# Patient Record
Sex: Female | Born: 1935 | Race: White | Hispanic: No | Marital: Married | Smoking: Never smoker
Health system: Southern US, Community
[De-identification: ages and names within clinical notes are randomized; demographics above are authoritative.]

## PROBLEM LIST (undated history)

## (undated) HISTORY — PX: GANGLION CYST EXCISION: SHX1691

## (undated) HISTORY — PX: JOINT REPLACEMENT: SHX530

## (undated) HISTORY — PX: RECTOCELE REPAIR: SHX761

## (undated) HISTORY — PX: TUBAL LIGATION: SHX77

---

## 2017-01-29 ENCOUNTER — Emergency Department (HOSPITAL_COMMUNITY): Payer: Medicare Other

## 2017-01-29 ENCOUNTER — Observation Stay (HOSPITAL_COMMUNITY)
Admission: EM | Admit: 2017-01-29 | Discharge: 2017-01-30 | Disposition: A | Discharge status: Home / Self Care | Payer: Medicare Other | Attending: Surgery | Admitting: Surgery

## 2017-01-29 ENCOUNTER — Encounter (HOSPITAL_COMMUNITY): Payer: Self-pay | Admitting: *Deleted

## 2017-01-29 DIAGNOSIS — K8020 Calculus of gallbladder without cholecystitis without obstruction: Secondary | ICD-10-CM | POA: Diagnosis not present

## 2017-01-29 DIAGNOSIS — K5730 Diverticulosis of large intestine without perforation or abscess without bleeding: Secondary | ICD-10-CM | POA: Insufficient documentation

## 2017-01-29 DIAGNOSIS — Z96642 Presence of left artificial hip joint: Secondary | ICD-10-CM | POA: Insufficient documentation

## 2017-01-29 DIAGNOSIS — S62309A Unspecified fracture of unspecified metacarpal bone, initial encounter for closed fracture: Secondary | ICD-10-CM

## 2017-01-29 DIAGNOSIS — S62302A Unspecified fracture of third metacarpal bone, right hand, initial encounter for closed fracture: Secondary | ICD-10-CM | POA: Insufficient documentation

## 2017-01-29 DIAGNOSIS — Z9851 Tubal ligation status: Secondary | ICD-10-CM | POA: Insufficient documentation

## 2017-01-29 DIAGNOSIS — E042 Nontoxic multinodular goiter: Secondary | ICD-10-CM | POA: Insufficient documentation

## 2017-01-29 DIAGNOSIS — Z91013 Allergy to seafood: Secondary | ICD-10-CM | POA: Insufficient documentation

## 2017-01-29 DIAGNOSIS — Z880 Allergy status to penicillin: Secondary | ICD-10-CM | POA: Insufficient documentation

## 2017-01-29 DIAGNOSIS — S2249XA Multiple fractures of ribs, unspecified side, initial encounter for closed fracture: Secondary | ICD-10-CM | POA: Diagnosis present

## 2017-01-29 DIAGNOSIS — G629 Polyneuropathy, unspecified: Secondary | ICD-10-CM | POA: Diagnosis not present

## 2017-01-29 DIAGNOSIS — M6281 Muscle weakness (generalized): Secondary | ICD-10-CM | POA: Diagnosis not present

## 2017-01-29 DIAGNOSIS — I700 Atherosclerosis of aorta: Secondary | ICD-10-CM | POA: Insufficient documentation

## 2017-01-29 DIAGNOSIS — S2242XA Multiple fractures of ribs, left side, initial encounter for closed fracture: Principal | ICD-10-CM | POA: Insufficient documentation

## 2017-01-29 DIAGNOSIS — S62304A Unspecified fracture of fourth metacarpal bone, right hand, initial encounter for closed fracture: Secondary | ICD-10-CM | POA: Insufficient documentation

## 2017-01-29 LAB — CBC WITH DIFFERENTIAL/PLATELET
BASOS ABS: 0 10*3/uL (ref 0.0–0.1)
BASOS PCT: 0 %
Eosinophils Absolute: 0.1 10*3/uL (ref 0.0–0.7)
Eosinophils Relative: 0 %
HCT: 39.5 % (ref 36.0–46.0)
HEMOGLOBIN: 12.9 g/dL (ref 12.0–15.0)
LYMPHS PCT: 5 %
Lymphs Abs: 0.7 10*3/uL (ref 0.7–4.0)
MCH: 28.3 pg (ref 26.0–34.0)
MCHC: 32.7 g/dL (ref 30.0–36.0)
MCV: 86.6 fL (ref 78.0–100.0)
Monocytes Absolute: 0.8 10*3/uL (ref 0.1–1.0)
Monocytes Relative: 6 %
NEUTROS ABS: 12.2 10*3/uL — AB (ref 1.7–7.7)
NEUTROS PCT: 89 %
Platelets: 232 10*3/uL (ref 150–400)
RBC: 4.56 MIL/uL (ref 3.87–5.11)
RDW: 15.5 % (ref 11.5–15.5)
WBC: 13.8 10*3/uL — ABNORMAL HIGH (ref 4.0–10.5)

## 2017-01-29 LAB — I-STAT CHEM 8, ED
BUN: 10 mg/dL (ref 6–20)
CHLORIDE: 104 mmol/L (ref 101–111)
CREATININE: 0.5 mg/dL (ref 0.44–1.00)
Calcium, Ion: 1.17 mmol/L (ref 1.15–1.40)
Glucose, Bld: 125 mg/dL — ABNORMAL HIGH (ref 65–99)
HEMATOCRIT: 39 % (ref 36.0–46.0)
HEMOGLOBIN: 13.3 g/dL (ref 12.0–15.0)
POTASSIUM: 3.2 mmol/L — AB (ref 3.5–5.1)
Sodium: 142 mmol/L (ref 135–145)
TCO2: 26 mmol/L (ref 0–100)

## 2017-01-29 MED ORDER — SODIUM CHLORIDE 0.9 % IV SOLN: 250.0000 mL | INTRAVENOUS | Status: DC | PRN

## 2017-01-29 MED ORDER — ONDANSETRON HCL 4 MG/2ML IJ SOLN: 4.0000 mg | Freq: Four times a day (QID) | INTRAMUSCULAR | Status: DC | PRN

## 2017-01-29 MED ORDER — SODIUM CHLORIDE 0.9% FLUSH: 3.0000 mL | INTRAVENOUS | Status: DC | PRN

## 2017-01-29 MED ORDER — DOCUSATE SODIUM 100 MG PO CAPS
100.0000 mg | ORAL_CAPSULE | Freq: Two times a day (BID) | ORAL | Status: DC
  Filled 2017-01-29: qty 1

## 2017-01-29 MED ORDER — OXYCODONE HCL 5 MG PO TABS: 5.0000 mg | ORAL_TABLET | ORAL | Status: DC | PRN

## 2017-01-29 MED ORDER — IBUPROFEN 800 MG PO TABS
800.0000 mg | ORAL_TABLET | Freq: Three times a day (TID) | ORAL | Status: DC
  Administered 2017-01-29 – 2017-01-30 (×2): 800 mg via ORAL
  Filled 2017-01-29 (×2): qty 1

## 2017-01-29 MED ORDER — LOSARTAN POTASSIUM 25 MG PO TABS
25.0000 mg | ORAL_TABLET | Freq: Every day | ORAL | Status: DC
  Administered 2017-01-30: 25 mg via ORAL
  Filled 2017-01-29: qty 1

## 2017-01-29 MED ORDER — METHOCARBAMOL 500 MG PO TABS
500.0000 mg | ORAL_TABLET | Freq: Four times a day (QID) | ORAL | Status: DC | PRN
  Administered 2017-01-29: 500 mg via ORAL
  Filled 2017-01-29: qty 1

## 2017-01-29 MED ORDER — ONDANSETRON HCL 4 MG PO TABS: 4.0000 mg | ORAL_TABLET | Freq: Four times a day (QID) | ORAL | Status: DC | PRN

## 2017-01-29 MED ORDER — GABAPENTIN 100 MG PO CAPS
100.0000 mg | ORAL_CAPSULE | Freq: Three times a day (TID) | ORAL | Status: DC
  Administered 2017-01-29 – 2017-01-30 (×2): 100 mg via ORAL
  Filled 2017-01-29 (×2): qty 1

## 2017-01-29 MED ORDER — BISACODYL 10 MG RE SUPP: 10.0000 mg | Freq: Every day | RECTAL | Status: DC | PRN

## 2017-01-29 MED ORDER — ACETAMINOPHEN 325 MG PO TABS
650.0000 mg | ORAL_TABLET | ORAL | Status: DC | PRN
  Administered 2017-01-29: 650 mg via ORAL
  Filled 2017-01-29: qty 2

## 2017-01-29 MED ORDER — SODIUM CHLORIDE 0.9% FLUSH
3.0000 mL | Freq: Two times a day (BID) | INTRAVENOUS | Status: DC
  Administered 2017-01-29 – 2017-01-30 (×2): 3 mL via INTRAVENOUS

## 2017-01-29 MED ORDER — HYDROMORPHONE HCL 1 MG/ML IJ SOLN: 0.5000 mg | INTRAMUSCULAR | Status: DC | PRN

## 2017-01-29 MED ORDER — IOPAMIDOL (ISOVUE-300) INJECTION 61%
INTRAVENOUS | Status: AC
  Administered 2017-01-29: 100 mL via INTRAVENOUS
  Filled 2017-01-29: qty 100

## 2017-01-29 MED ORDER — ENOXAPARIN SODIUM 40 MG/0.4ML ~~LOC~~ SOLN
40.0000 mg | SUBCUTANEOUS | Status: DC
  Administered 2017-01-29: 40 mg via SUBCUTANEOUS
  Filled 2017-01-29: qty 0.4
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
## 2017-01-29 NOTE — Consult Note (Signed)
I was contacted by the emergency department for the patient's hand injury. The patient's chart was reviewed. The patient did sustain the right long and ring finger metacarpal fractures. These would typically be managed as an outpatient. She is being admitted for rib injuries. The patient would be best served placing her in a short arm volar splint all the way out to the fingertips. I be happy to see her back in the office as an outpatient. Generally given the displacement and the multiple metacarpal fractures one would consider operative intervention. This can be done and managed as an outpatient. If she is going to spend several days in the hospital I may be able to perform the operation later on in the week. If you could please contact me if she remains in the hospital for several days so that I can coordinate her care. Otherwise, I be happy to see her back in the office in 7-10 days. Please contact me directly via my cell phone at [phone]

## 2017-01-29 NOTE — ED Notes (Signed)
Pt remains in x-ray at this time.

## 2017-01-29 NOTE — ED Notes (Signed)
Pt given ice bags for left ankle/foot and right wrist per Kelly(RN)

## 2017-01-29 NOTE — ED Notes (Signed)
Transported to xray 

## 2017-01-29 NOTE — Progress Notes (Signed)
Orthopedic Tech Progress Note Patient Details:  Sylvan CheeseMary Meenach 03/29/1934 [id number]  Ortho Devices Type of Ortho Device: Ace wrap, Volar splint Ortho Device/Splint Location: RUE Ortho Device/Splint Interventions: Ordered, Application   Jennye MoccasinHughes, Jamesyn Lindell Craig 01/29/2017, 6:14 PM

## 2017-01-29 NOTE — ED Notes (Signed)
Pt returned from x-ray, daughter at bedside.  No complaints voiced at this time.  Ice pack to right hand

## 2017-01-29 NOTE — ED Notes (Signed)
Daughter Vicki Hughes returned call will be coming to ED.

## 2017-01-29 NOTE — ED Notes (Signed)
Ice pack applied to right hand and left ankle

## 2017-01-29 NOTE — ED Notes (Signed)
One gold colored watch and one gold colored wedding band removed by pt and given to her daughter.

## 2017-01-29 NOTE — ED Notes (Signed)
Attempted to call daughter Jasmine DecemberSharon [phone] no answer , message left

## 2017-01-29 NOTE — ED Notes (Signed)
Volar splint applied to right wrist by ortho tech.  Pt tolerated well
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
## 2017-01-30 ENCOUNTER — Observation Stay (HOSPITAL_COMMUNITY): Payer: Medicare Other

## 2017-01-30 DIAGNOSIS — S2242XA Multiple fractures of ribs, left side, initial encounter for closed fracture: Secondary | ICD-10-CM | POA: Diagnosis not present

## 2017-01-30 LAB — CBC
HEMATOCRIT: 36 % (ref 36.0–46.0)
Hemoglobin: 11.3 g/dL — ABNORMAL LOW (ref 12.0–15.0)
MCH: 27.4 pg (ref 26.0–34.0)
MCHC: 31.4 g/dL (ref 30.0–36.0)
MCV: 87.4 fL (ref 78.0–100.0)
PLATELETS: 224 10*3/uL (ref 150–400)
RBC: 4.12 MIL/uL (ref 3.87–5.11)
RDW: 15.8 % — AB (ref 11.5–15.5)
WBC: 6.9 10*3/uL (ref 4.0–10.5)

## 2017-01-30 LAB — BASIC METABOLIC PANEL
ANION GAP: 6 (ref 5–15)
BUN: 8 mg/dL (ref 6–20)
CALCIUM: 8.5 mg/dL — AB (ref 8.9–10.3)
CO2: 29 mmol/L (ref 22–32)
Chloride: 105 mmol/L (ref 101–111)
Creatinine, Ser: 0.65 mg/dL (ref 0.44–1.00)
Glucose, Bld: 124 mg/dL — ABNORMAL HIGH (ref 65–99)
Potassium: 3.6 mmol/L (ref 3.5–5.1)
SODIUM: 140 mmol/L (ref 135–145)

## 2017-01-30 MED ORDER — METHOCARBAMOL 500 MG PO TABS: 500.0000 mg | ORAL_TABLET | Freq: Four times a day (QID) | ORAL | 0 refills | Status: AC | PRN

## 2017-01-30 MED ORDER — GABAPENTIN 100 MG PO CAPS: 100.0000 mg | ORAL_CAPSULE | Freq: Three times a day (TID) | ORAL | 0 refills | Status: AC

## 2017-01-30 MED ORDER — OXYCODONE HCL 5 MG PO TABS: 5.0000 mg | ORAL_TABLET | Freq: Four times a day (QID) | ORAL | 0 refills | Status: AC | PRN

## 2017-01-30 NOTE — Discharge Instructions (Signed)
Be sure someone is staying with you at all times. Do not be home by yourself until approved by your doctor.   Cast or Splint Care, Adult Casts and splints are supports that are worn to protect broken bones and other injuries. A cast or splint may hold a bone still and in the correct position while it heals. Casts and splints may also help ease pain, swelling, and muscle spasms. A cast is a hardened support that is usually made of fiberglass or plaster. It is custom-fit to the body and it offers more protection than a splint. It cannot be taken off and put back on. A splint is a type of soft support that is usually made from cloth and elastic. It can be adjusted or taken off as needed. You may need a cast or a splint if you:  Have a broken bone.  Have a soft-tissue injury.  Need to keep an injured body part from moving (keep it immobile) after surgery.  How is this treated? If you have a cast:  Do not stick anything inside the cast to scratch your skin. Sticking something in the cast increases your risk of infection.  Check the skin around the cast every day. Tell your health care provider about any concerns.  You may put lotion on dry skin around the edges of the cast. Do not put lotion on the skin underneath the cast.  Keep the cast clean.  If the cast is not waterproof: ? Do not let it get wet. ? Cover it with a watertight covering when you take a bath or a shower. If you have a splint:  Wear it as told by your health care provider. Remove it only as told by your health care provider.  Loosen the splint if your fingers or toes tingle, become numb, or turn cold and blue.  Keep the splint clean.  If the splint is not waterproof: ? Do not let it get wet. ? Cover it with a watertight covering when you take a bath or a shower. Bathing  Do not take baths or swim until your health care provider approves. Ask your health care provider if you can take showers. You may only be allowed  to take sponge baths for bathing.  If your cast or splint is not waterproof, cover it with a watertight covering when you take a bath or shower. Managing pain, stiffness, and swelling  Move your fingers or toes often to avoid stiffness and to lessen swelling.  Raise (elevate) the injured area above the level of your heart while sitting or lying down. Safety  Do not use the injured limb to support your body weight until your health care provider says that it is okay.  Use crutches or other assistive devices as told by your health care provider. General instructions  Do not put pressure on any part of the cast or splint until it is fully hardened. This may take several hours.  Return to your normal activities as told by your health care provider. Ask your health care provider what activities are safe for you.  Take over-the-counter and prescription medicines only as told by your health care provider.  Keep all follow-up visits as told by your health care provider. This is important. Contact a health care provider if:  Your cast or splint gets damaged.  The skin around the cast gets red or raw.  The skin under the cast is extremely itchy or painful.  Your cast or splint  feels very uncomfortable.  Your cast or splint is too tight or too loose.  Your cast becomes wet or it develops a soft spot or area.  You get an object stuck under your cast. Get help right away if:  Your pain is getting worse.  The injured area tingles, becomes numb, or turns cold and blue.  The part of your body above or below the cast is swollen and discolored.  You cannot feel or move your fingers or toes.  There is fluid leaking through the cast.  You have severe pain or pressure under the cast.  You have trouble breathing.  You have shortness of breath.  You have chest pain. This information is not intended to replace advice given to you by your health care provider. Make sure you discuss any  questions you have with your health care provider. Document Released: 08/11/2000 Document Revised: 03/04/2016 Document Reviewed: 02/05/2016 Elsevier Interactive Patient Education  2018 [employer]   Rib Fracture A rib fracture is a break or crack in one of the bones of the ribs. The ribs are like a cage that goes around your upper chest. A broken or cracked rib is often painful, but most do not cause other problems. Most rib fractures heal on their own in 1-3 months. Follow these instructions at home:  Avoid activities that cause pain to the injured area. Protect your injured area.  Slowly increase activity as told by your doctor.  Take medicine as told by your doctor.  Put ice on the injured area for the first 1-2 days after you have been treated or as told by your doctor. ? Put ice in a plastic bag. ? Place a towel between your skin and the bag. ? Leave the ice on for 15-20 minutes at a time, every 2 hours while you are awake.  Do deep breathing as told by your doctor. You may be told to: ? Take deep breaths many times a day. ? Cough many times a day while hugging a pillow. ? Use a device (incentive spirometer) to perform deep breathing many times a day.  Drink enough fluids to keep your pee (urine) clear or pale yellow.  Do not wear a rib belt or binder. These do not allow you to breathe deeply. Get help right away if:  You have a fever.  You have trouble breathing.  You cannot stop coughing.  You cough up thick or bloody spit (mucus).  You feel sick to your stomach (nauseous), throw up (vomit), or have belly (abdominal) pain.  Your pain gets worse and medicine does not help. This information is not intended to replace advice given to you by your health care provider. Make sure you discuss any questions you have with your health care provider. Document Released: 05/23/2008 Document Revised: 01/20/2016 Document Reviewed: 10/16/2012 Elsevier Interactive Patient  Education  [employer].
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
## 2017-01-30 NOTE — Progress Notes (Signed)
Orthopedic Tech Progress Note Patient Details:  Vicki CheeseMary Hughes 01/22/1934 [id number]  Ortho Devices Type of Ortho Device: Ace wrap, Volar splint Ortho Device/Splint Location: rue Ortho Device/Splint Interventions: Application   [name] 01/30/2017, 9:38 AM

## 2017-01-30 NOTE — Progress Notes (Signed)
Patient discharged to home with instructions and prescriptions. 

## 2017-01-30 NOTE — Care Management Note (Signed)
Case Management Note  Patient Details  Name: Sylvan CheeseMary Haning MRN: [id number] Date of Birth: 04/07/1934  Subjective/Objective:                    Action/Plan:  No PT/OT follow up , no DME. From home with family. No discharge needs identified at this time  Expected Discharge Date:                  Expected Discharge Plan:  Home/Self Care  In-House Referral:     Discharge planning Services     Post Acute Care Choice:    Choice offered to:     DME Arranged:    DME Agency:     HH Arranged:    HH Agency:     Status of Service:  Completed, signed off  If discussed at [employer] Length of Stay Meetings, dates discussed:    Additional Comments:  Kingsley PlanWile, Josef Tourigny Marie, RN 01/30/2017, 11:08 AM
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
## 2017-02-01 NOTE — Progress Notes (Signed)
Previously omitted G-codes   01/30/17 0845  PT G-Codes **NOT FOR INPATIENT CLASS**  Functional Assessment Tool Used AM-PAC 6 Clicks Basic Mobility  Functional Limitation Mobility: Walking and moving around  Mobility: Walking and Moving Around Current Status (Z6109(G8978) CJ  Mobility: Walking and Moving Around Goal Status (U0454(G8979) CJ  Mobility: Walking and Moving Around Discharge Status [phone]([id number]) Donetta PottsJ   Keaton Beichner B. Beverely RisenVan Fleet PT, DPT Acute Rehabilitation  [phone] Pager [phone]

## 2017-02-12 DIAGNOSIS — S62309A Unspecified fracture of unspecified metacarpal bone, initial encounter for closed fracture: Secondary | ICD-10-CM
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
## 2022-06-28 DEATH — deceased
